# Patient Record
Sex: Female | Born: 1980 | Race: Black or African American | Hispanic: No | Marital: Married | Smoking: Never smoker
Health system: Southern US, Community
[De-identification: ages and names within clinical notes are randomized; demographics above are authoritative.]

## PROBLEM LIST (undated history)

## (undated) DIAGNOSIS — J45909 Unspecified asthma, uncomplicated: Secondary | ICD-10-CM

## (undated) HISTORY — DX: Unspecified asthma, uncomplicated: J45.909

---

## 2009-06-24 HISTORY — PX: TMJ ARTHROSCOPY: SHX1067

## 2018-07-10 ENCOUNTER — Institutional Professional Consult (permissible substitution): Payer: Self-pay | Admitting: Pulmonary Disease

## 2019-09-09 ENCOUNTER — Ambulatory Visit: Payer: Self-pay | Admitting: Allergy

## 2019-09-15 NOTE — Progress Notes (Signed)
New Patient Note  RE: Tracie Velasquez MRN: [id number] DOB: 06-Feb-1981 Date of Office Visit: 09/16/2019  Referring provider: Roger Kill, * Primary care provider: Roger Kill, PA-C  Chief Complaint: Patch Testing (Metal) and Allergy Testing (Shellfish)  History of Present Illness: I had the pleasure of seeing [facility] for initial evaluation at the [facility] on 09/16/2019. She is a 39 y.o. female, who is referred here by Roger Kill, PA-C and her oral surgeon for the evaluation of metal testing.  Metal: Patient is planning to have a TMJ joint replacement by oral surgeon and they want to confirm that she has no sensitivity to metals. She noted some itching after wearing costume earrings. Tolerates gold and silver. No previous joint replacements or metals in her body.   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 The reaction occurred in her 3s, after she ate shrimp. Symptoms started at least 1+ hour afterwards while she was exercising and was in the form of facial hives, facial swelling. Denies any wheezing, abdominal pain, diarrhea, vomiting. Denies any associated cofactors such as infection, NSAID use, or alcohol consumption. The symptoms lasted for a few hours. She was evaluated in ED and received some unknown medications. Since this episode, she does not report other accidental exposures to shellfish. She does not have access to epinephrine autoinjector. Prior to this she had shrimp with no issues.   Past work up includes:skin testing at that time was positive to shellfish per patient report.  Dietary History: patient has been eating other foods including lactose free milk, eggs, peanut, treenuts, sesame, seafood, soy, wheat, meats, fruits and vegetables.  She reports reading labels and avoiding shellfish and mollusks in diet completely.  Patient does have a history of hives in the past but states that the above reaction was  different than her normal hive outbreaks.   Assessment and Plan: Lekisha is a 39 y.o. female with: Contact dermatitis Patient concerned about metal allergy as she is due for TMJ joint replacement.  Patient has contact pruritus after wearing costume earrings but tolerates gold and silver.  No previous joint replacements.  Discussed with patient that she needs to check with her oral surgeon what components they are planning on using.  Gave her a list of the metal panel and true patch testing. If surgeon requires additional items to be tested then she must provide a sample that we can use to patch test with.   Adverse food reaction Reaction to shrimp in her 20s in the form of facial hives and facial swelling at least 1 hour after ingestion while exercising.  Evaluated in the ER and was treated with unknown medications with good benefit.  Previously tolerated shellfish but currently avoiding.  Skin testing in her 85s was positive to shellfish per patient report.  Tolerates finned fish with no issues.  Today's skin testing showed: Negative to shellfish and mollusks.   Continue strict avoidance of shellfish and mollusks.  Food allergen skin testing has excellent negative predictive value however there is still a 5% chance that the allergy exists. Therefore, we will investigate further with serum specific IgE levels and, if negative then schedule for open graded oral food challenge. A laboratory order form has been provided for serum specific IgE against shellfish and mollusks. Until the food allergy has been definitively ruled out, the patient is to continue meticulous avoidance of above foods.  I have prescribed epinephrine injectable and demonstrated proper use. For mild  symptoms you can take over the counter antihistamines such as Benadryl and monitor symptoms closely. If symptoms worsen or if you have severe symptoms including breathing issues, throat closure, significant swelling, whole body  hives, severe diarrhea and vomiting, lightheadedness then inject epinephrine and seek immediate medical care afterwards.  Food action plan given.   Mild intermittent asthma without complication Diagnosed with asthma in her 20s and uses albuterol as needed.  Last use about 1 year ago.  Triggers are unknown.  Today's spirometry showed some possible restriction.  May use albuterol rescue inhaler 2 puffs or nebulizer every 4 to 6 hours as needed for shortness of breath, chest tightness, coughing, and wheezing. May use albuterol rescue inhaler 2 puffs 5 to 15 minutes prior to strenuous physical activities. Monitor frequency of use.   History of urticaria History of hives as a child which she has outgrown.  Triggers include temperature change and sweating.  Return for Patch testing.  Meds ordered this encounter  Medications  . EPINEPHrine (AUVI-Q) 0.3 mg/0.3 mL IJ SOAJ injection    Sig: Inject 0.3 mLs (0.3 mg total) into the muscle once for 1 dose. As directed for life-threatening allergic reactions    Dispense:  2 each    Refill:  1    Please call [phone] for delivery.    Lab Orders     Allergen Profile, Shellfish  Other allergy screening: Asthma: yes  Diagnosed with asthma in her 20s and uses albuterol as needed. Last use was about 1 year ago. Triggers include not sure.  Rhino conjunctivitis: no  Medication allergy: no Hymenoptera allergy: no Urticaria: yes  Yes as a child but not anymore. Triggers include temperature change and sweating.  Eczema:no History of recurrent infections suggestive of immunodeficency: no  Diagnostics: Spirometry:  Tracings reviewed. Her effort: Good reproducible efforts. FVC: 3.16L FEV1: 2.67L, 72% predicted FEV1/FVC ratio: 84% Interpretation: Spirometry consistent with possible restrictive disease.  Please see scanned spirometry results for details.  Skin Testing: Select foods. Negative test to: shellfish and mollusks.  Results discussed  with patient/family. Food Adult Perc - 09/16/19 1400    Time Antigen Placed  1444    Allergen Manufacturer  Waynette Buttery    Location  Arm    Number of allergen test  8     Control-buffer 50% Glycerol  Negative    Control-Histamine 1 mg/ml  2+    8. Shellfish Mix  Negative    25. Shrimp  Negative    26. Crab  Negative    27. Lobster  Negative    28. Oyster  Negative    29. Scallops  Negative       Past Medical History: Patient Active Problem List   Diagnosis Date Noted  . Adverse food reaction 09/16/2019  . Mild intermittent asthma without complication 09/16/2019  . Contact dermatitis 09/16/2019  . History of urticaria 09/16/2019   Past Medical History:  Diagnosis Date  . Asthma    Past Surgical History: Past Surgical History:  Procedure Laterality Date  . TMJ ARTHROSCOPY  2011   Medication List:  Current Outpatient Medications  Medication Sig Dispense Refill  . albuterol (VENTOLIN HFA) 108 (90 Base) MCG/ACT inhaler Inhale 2 puffs into the lungs every 6 (six) hours as needed for wheezing or shortness of breath.    . phentermine 37.5 MG capsule Take 37.5 mg by mouth every morning.    Marland Kitchen EPINEPHrine (AUVI-Q) 0.3 mg/0.3 mL IJ SOAJ injection Inject 0.3 mLs (0.3 mg total) into the muscle  once for 1 dose. As directed for life-threatening allergic reactions 2 each 1   No current facility-administered medications for this visit.   Allergies: Not on File Social History: Social History   Socioeconomic History  . Marital status: Married    Spouse name: Not on file  . Number of children: Not on file  . Years of education: Not on file  . Highest education level: Not on file  Occupational History  . Not on file  Tobacco Use  . Smoking status: Never Smoker  . Smokeless tobacco: Never Used  Substance and Sexual Activity  . Alcohol use: Not Currently  . Drug use: Never  . Sexual activity: Not on file  Other Topics Concern  . Not on file  Social History Narrative  . Not on file    Social Determinants of Health   Financial Resource Strain:   . Difficulty of Paying Living Expenses:   Food Insecurity:   . Worried About Programme researcher, broadcasting/film/video in the Last Year:   . Barista in the Last Year:   Transportation Needs:   . Freight forwarder (Medical):   Marland Kitchen Lack of Transportation (Non-Medical):   Physical Activity:   . Days of Exercise per Week:   . Minutes of Exercise per Session:   Stress:   . Feeling of Stress :   Social Connections:   . Frequency of Communication with Friends and Family:   . Frequency of Social Gatherings with Friends and Family:   . Attends Religious Services:   . Active Member of Clubs or Organizations:   . Attends Banker Meetings:   Marland Kitchen Marital Status:    Lives in a 37 year old home. Smoking: denies Occupation: Nurse, adult HistorySurveyor, minerals in the house: no Engineer, civil (consulting) in the family room: no Carpet in the bedroom: no Heating: gas Cooling: central Pet: yes 1 dog x 4-5 years  Family History: Family History  Problem Relation Age of Onset  . Eczema Mother   . Asthma Sister    Review of Systems  Constitutional: Negative for appetite change, chills, fever and unexpected weight change.  HENT: Negative for congestion and rhinorrhea.   Eyes: Negative for itching.  Respiratory: Negative for cough, chest tightness, shortness of breath and wheezing.   Cardiovascular: Negative for chest pain.  Gastrointestinal: Negative for abdominal pain.  Genitourinary: Negative for difficulty urinating.  Skin: Negative for rash.  Allergic/Immunologic: Positive for food allergies.  Neurological: Negative for headaches.   Objective: BP 114/76 (BP Location: Left Arm, Patient Position: Sitting, Cuff Size: Normal)   Pulse 78   Temp (!) 96.7 F (35.9 C) (Temporal)   Resp 18   Ht 5' 10.75" (1.797 m)   Wt 227 lb 12.8 oz (103.3 kg)   SpO2 100%   BMI 32.00 kg/m  Body mass index is 32 kg/m. Physical  Exam  Constitutional: She is oriented to person, place, and time. She appears well-developed and well-nourished.  HENT:  Head: Normocephalic and atraumatic.  Right Ear: External ear normal.  Left Ear: External ear normal.  Nose: Nose normal.  Mouth/Throat: Oropharynx is clear and moist.  Eyes: Conjunctivae and EOM are normal.  Cardiovascular: Normal rate, regular rhythm and normal heart sounds. Exam reveals no gallop and no friction rub.  No murmur heard. Pulmonary/Chest: Effort normal and breath sounds normal. She has no wheezes. She has no rales.  Abdominal: Soft.  Musculoskeletal:     Cervical back: Neck supple.  Neurological: She is alert and oriented to person, place, and time.  Skin: Skin is warm. No rash noted.  Psychiatric: She has a normal mood and affect. Her behavior is normal.  Nursing note and vitals reviewed.  The plan was reviewed with the patient/family, and all questions/concerned were addressed.  It was my pleasure to see Rozlynn today and participate in her care. Please feel free to contact me with any questions or concerns.  Sincerely,  Rexene Alberts, DO Allergy & Immunology  [facility] office: [phone] [facility] office: [place] office: [phone]

## 2019-09-16 ENCOUNTER — Other Ambulatory Visit: Payer: Self-pay

## 2019-09-16 ENCOUNTER — Ambulatory Visit (INDEPENDENT_AMBULATORY_CARE_PROVIDER_SITE_OTHER): Payer: Managed Care, Other (non HMO) | Admitting: Allergy

## 2019-09-16 ENCOUNTER — Encounter: Payer: Self-pay | Admitting: Allergy

## 2019-09-16 VITALS — BP 114/76 | HR 78 | Temp 96.7°F | Resp 18 | Ht 70.75 in | Wt 227.8 lb

## 2019-09-16 DIAGNOSIS — J4520 Mild intermittent asthma, uncomplicated: Secondary | ICD-10-CM | POA: Diagnosis not present

## 2019-09-16 DIAGNOSIS — L259 Unspecified contact dermatitis, unspecified cause: Secondary | ICD-10-CM

## 2019-09-16 DIAGNOSIS — T781XXA Other adverse food reactions, not elsewhere classified, initial encounter: Secondary | ICD-10-CM | POA: Insufficient documentation

## 2019-09-16 DIAGNOSIS — T781XXD Other adverse food reactions, not elsewhere classified, subsequent encounter: Secondary | ICD-10-CM | POA: Diagnosis not present

## 2019-09-16 DIAGNOSIS — L239 Allergic contact dermatitis, unspecified cause: Secondary | ICD-10-CM | POA: Insufficient documentation

## 2019-09-16 DIAGNOSIS — Z872 Personal history of diseases of the skin and subcutaneous tissue: Secondary | ICD-10-CM | POA: Diagnosis not present

## 2019-09-16 MED ORDER — EPINEPHRINE 0.3 MG/0.3ML IJ SOAJ: 0.3000 mg | Freq: Once | INTRAMUSCULAR | 1 refills | Status: AC

## 2019-09-16 NOTE — Assessment & Plan Note (Signed)
Patient concerned about metal allergy as she is due for TMJ joint replacement.  Patient has contact pruritus after wearing costume earrings but tolerates gold and silver.  No previous joint replacements.  Discussed with patient that she needs to check with her oral surgeon what components they are planning on using.  Gave her a list of the metal panel and true patch testing. If surgeon requires additional items to be tested then she must provide a sample that we can use to patch test with.

## 2019-09-16 NOTE — Assessment & Plan Note (Signed)
History of hives as a child which she has outgrown.  Triggers include temperature change and sweating.

## 2019-09-16 NOTE — Patient Instructions (Addendum)
Today's skin testing showed:  Negative to shellfish and mollusks.   Food:  Continue strict avoidance of shellfish and mollusks.  Food allergen skin testing has excellent negative predictive value however there is still a 5% chance that the allergy exists. Therefore, we will investigate further with serum specific IgE levels and, if negative then schedule for open graded oral food challenge. A laboratory order form has been provided for serum specific IgE against shellfish and mollusks. Until the food allergy has been definitively ruled out, the patient is to continue meticulous avoidance of above foods.   I have prescribed epinephrine injectable and demonstrated proper use. For mild symptoms you can take over the counter antihistamines such as Benadryl and monitor symptoms closely. If symptoms worsen or if you have severe symptoms including breathing issues, throat closure, significant swelling, whole body hives, severe diarrhea and vomiting, lightheadedness then inject epinephrine and seek immediate medical care afterwards.  Food action plan given.   Asthma:  May use albuterol rescue inhaler 2 puffs or nebulizer every 4 to 6 hours as needed for shortness of breath, chest tightness, coughing, and wheezing. May use albuterol rescue inhaler 2 puffs 5 to 15 minutes prior to strenuous physical activities. Monitor frequency of use.   Metals and patch testing:  Please check with your oral surgeon what metals/materials they need the patch testing for. Metals Patch    Aluminum Hydroxide 10%    Chromium chloride 1%    Cobalt chloride hexahydrate 1%    Molybdenum chloride 0.5%    Nickel sulfate hexahydrate 5%    Potassium dichromate 0.25%    Copper sulfate pentahydrate 2%    Tantal 1%    Titanium 0.1%    Manganese chloride 0.5%    Vanadium Pentoxide 10%    Patches are best placed on Monday with return to office on Wednesday and Friday of same week for readings.  Patches once placed should  not get wet.  You do not have to stop any medications for patch testing but should not be on oral prednisone. You can schedule a patch testing visit when convenient for your schedule.   True Test looks for the following sensitivities:       Schedule for patch testing.

## 2019-09-16 NOTE — Assessment & Plan Note (Signed)
Diagnosed with asthma in her 65s and uses albuterol as needed.  Last use about 1 year ago.  Triggers are unknown.  Today's spirometry showed some possible restriction.  May use albuterol rescue inhaler 2 puffs or nebulizer every 4 to 6 hours as needed for shortness of breath, chest tightness, coughing, and wheezing. May use albuterol rescue inhaler 2 puffs 5 to 15 minutes prior to strenuous physical activities. Monitor frequency of use.

## 2019-09-16 NOTE — Assessment & Plan Note (Signed)
Reaction to shrimp in her 20s in the form of facial hives and facial swelling at least 1 hour after ingestion while exercising.  Evaluated in the ER and was treated with unknown medications with good benefit.  Previously tolerated shellfish but currently avoiding.  Skin testing in her 109s was positive to shellfish per patient report.  Tolerates finned fish with no issues.  Today's skin testing showed: Negative to shellfish and mollusks.   Continue strict avoidance of shellfish and mollusks.  Food allergen skin testing has excellent negative predictive value however there is still a 5% chance that the allergy exists. Therefore, we will investigate further with serum specific IgE levels and, if negative then schedule for open graded oral food challenge. A laboratory order form has been provided for serum specific IgE against shellfish and mollusks. Until the food allergy has been definitively ruled out, the patient is to continue meticulous avoidance of above foods.  I have prescribed epinephrine injectable and demonstrated proper use. For mild symptoms you can take over the counter antihistamines such as Benadryl and monitor symptoms closely. If symptoms worsen or if you have severe symptoms including breathing issues, throat closure, significant swelling, whole body hives, severe diarrhea and vomiting, lightheadedness then inject epinephrine and seek immediate medical care afterwards.  Food action plan given.

## 2019-10-04 ENCOUNTER — Telehealth: Payer: Self-pay | Admitting: Allergy

## 2019-10-04 NOTE — Telephone Encounter (Signed)
Called and left a detailed voicemail per DPR permission advising that we do have Nickel in our metals patch testing. Asked patient to call back with any further questions.

## 2019-10-04 NOTE — Telephone Encounter (Signed)
Patient is scheduled for patch testing next week. She called this morning to make sure we have the correct metal that she needs to be tested for; it is nickel. She said she came to an appointment before and we did not have it.

## 2019-10-10 NOTE — Progress Notes (Signed)
   Follow Up Note  RE: Tracie Velasquez MRN: [id number] DOB: [birth year] Date of Office Visit: 10/11/2019  Referring provider: Roger Kill, * Primary care provider: Roger Kill, PA-C  History of Present Illness: I had the pleasure of seeing [facility] for a follow up visit at the [facility] [place] on 10/11/2019. She is a 39 y.o. female, who is being followed for dermatitis, adverse food reaction, asthma and h/o urticaria. Today she is here for patch test placement, given suspected history of contact dermatitis.   Contact dermatitis Surgeon told her they will be using Nickel material for the TMJ joint replacement.   Diagnostics: TRUE Test and metal patches placed.   Assessment and Plan: [name] is a 39 y.o. female with: Contact dermatitis Past history - Patient concerned about metal allergy as she is due for TMJ joint replacement.  Patient has contact pruritus after wearing costume earrings but tolerates gold and silver.  No previous joint replacements. Interim history - planning to use Nickel per surgeon.  TRUE test and metal patches placed today.    The patient was instructed regarding proper care of the patches for the next 48 hours. Do not get patches wet - avoid showering until the next visit. Do not engage in vigorous physical activity.  Patient will follow up in 48 hours and 96 hours for patch readings.  It was my pleasure to see Tracie Velasquez today and participate in her care. Please feel free to contact me with any questions or concerns.  Sincerely,  Wyline Mood, DO Allergy & Immunology  [facility] office: [phone] [facility] office: [phone] [place] office: [phone]

## 2019-10-11 ENCOUNTER — Ambulatory Visit (INDEPENDENT_AMBULATORY_CARE_PROVIDER_SITE_OTHER): Payer: Managed Care, Other (non HMO) | Admitting: Allergy

## 2019-10-11 ENCOUNTER — Encounter: Payer: Self-pay | Admitting: Allergy

## 2019-10-11 ENCOUNTER — Other Ambulatory Visit: Payer: Self-pay

## 2019-10-11 VITALS — BP 138/90 | HR 78 | Temp 97.3°F | Resp 16 | Ht 71.0 in | Wt 220.2 lb

## 2019-10-11 DIAGNOSIS — L239 Allergic contact dermatitis, unspecified cause: Secondary | ICD-10-CM

## 2019-10-11 NOTE — Assessment & Plan Note (Signed)
Past history - Patient concerned about metal allergy as she is due for TMJ joint replacement.  Patient has contact pruritus after wearing costume earrings but tolerates gold and silver.  No previous joint replacements. Interim history - planning to use Nickel per surgeon.  TRUE test and metal patches placed today.

## 2019-10-11 NOTE — Patient Instructions (Signed)
Patches placed today. Please avoid strenuous physical activities and do not get the patches on the back wet. No showering until final patch reading done. Okay to take antihistamines for itching but avoid placing any creams on the back where the patches are. We will remove the patches on Wednesday and will do our initial read. Then you will come back on Friday for a final read. 

## 2019-10-12 NOTE — Progress Notes (Signed)
   Follow Up Note  RE: Tracie Velasquez MRN: [id number] DOB: 28-May-1981 Date of Office Visit: 10/13/2019  Referring provider: Roger Kill, * Primary care provider: Roger Kill, PA-C  History of Present Illness: I had the pleasure of seeing Tracie Velasquez for a follow up visit at [facility] [place] on 10/13/2019. She is a 39 y.o. female, who is being followed for dermatitis, adverse food reaction, asthma and h/o urticaria. Today she is here for initial patch test interpretation, given suspected history of contact dermatitis.   Diagnostics:  TRUE TEST and metal patch 48 hour reading:  T.R.U.E. Test - 10/13/19 1600    1. Nickel Sulfate  1    4. Potassium Dichromate  1        Assessment and Plan: Tracie Velasquez is a 39 y.o. female with: Allergic contact dermatitis Past history - Patient concerned about metal allergy as she is due for TMJ joint replacement.  Patient has contact pruritus after wearing costume earrings but tolerates gold and silver.  No previous joint replacements. Interim history - planning to use Nickel per surgeon.  TRUE test and metal patches removed today.  Positive to nickel sulfate and potassium dichromate.   The patient has been provided detailed information regarding the substances she is sensitive to, as well as products containing the substances.  Meticulous avoidance of these substances is recommended. If avoidance is not possible, the use of barrier creams or lotions is recommended.  Return in about 2 days (around 10/15/2019) for Patch reading.  It was my pleasure to see Tracie Velasquez today and participate in her care. Please feel free to contact me with any questions or concerns.  Sincerely,  Wyline Mood, DO Allergy & Immunology  [facility] office: [phone] [facility] office: [phone] [place] office: [phone]

## 2019-10-13 ENCOUNTER — Ambulatory Visit: Payer: Managed Care, Other (non HMO) | Admitting: Allergy

## 2019-10-13 ENCOUNTER — Other Ambulatory Visit: Payer: Self-pay

## 2019-10-13 ENCOUNTER — Encounter: Payer: Self-pay | Admitting: Allergy

## 2019-10-13 DIAGNOSIS — L239 Allergic contact dermatitis, unspecified cause: Secondary | ICD-10-CM

## 2019-10-13 NOTE — Assessment & Plan Note (Signed)
Past history - Patient concerned about metal allergy as she is due for TMJ joint replacement.  Patient has contact pruritus after wearing costume earrings but tolerates gold and silver.  No previous joint replacements. Interim history - planning to use Nickel per surgeon.  TRUE test and metal patches removed today.  Positive to nickel sulfate and potassium dichromate.

## 2019-10-15 ENCOUNTER — Ambulatory Visit: Payer: Managed Care, Other (non HMO) | Admitting: Family Medicine

## 2019-10-15 ENCOUNTER — Other Ambulatory Visit: Payer: Self-pay

## 2019-10-15 ENCOUNTER — Encounter: Payer: Self-pay | Admitting: Family Medicine

## 2019-10-15 DIAGNOSIS — L230 Allergic contact dermatitis due to metals: Secondary | ICD-10-CM

## 2019-10-15 LAB — ALLERGEN PROFILE, SHELLFISH
Clam IgE: <0.1 kU/L
F023-IgE Crab: <0.1 kU/L
F080-IgE Lobster: <0.1 kU/L
F290-IgE Oyster: <0.1 kU/L
Scallop IgE: <0.1 kU/L
Shrimp IgE: <0.1 kU/L

## 2019-10-15 NOTE — Patient Instructions (Signed)
TRUE TEST 96-hour hour reading: positive reaction to #1 (Nickel Sulfate) and possible reaction to #4 (Potassium dichromate)   Metal patch test 96 hour reading: positive to nickel sulfate hexahydrate 5% Plan:   Allergic contact dermatitis - The patient has been provided detailed information regarding the substances she is sensitive to, as well as products containing the substances.   - Meticulous avoidance of these substances is recommended.  - If avoidance is not possible, the use of barrier creams or lotions is recommended. - If symptoms persist or progress despite meticulous avoidance of the substances listed above, a dermatology referral may be warranted.  Call the clinic if this treatment plan is not working well for you  Follow up in 6 months or sooner if needed.

## 2019-10-15 NOTE — Progress Notes (Signed)
    Follow-up Note  RE: Tracie Velasquez MRN: [id number] DOB: [birth year] Date of Office Visit: 10/15/2019  Primary care provider: Roger Kill, PA-C Referring provider: Herbie [place] returns to the office today for the final patch test interpretation, given suspected history of contact dermatitis.    Diagnostics:   TRUE TEST 96-hour hour reading: positive reaction to #1 (Nickel Sulfate) and possible reaction to #4 (Potassium dichromate)   Metal patch test 96 hour reading: positive to nickel sulfate hexahydrate 5% Plan:   Allergic contact dermatitis - The patient has been provided detailed information regarding the substances she is sensitive to, as well as products containing the substances.   - Meticulous avoidance of these substances is recommended.  - If avoidance is not possible, the use of barrier creams or lotions is recommended. - If symptoms persist or progress despite meticulous avoidance of the substances listed above, a dermatology referral may be warranted.

## 2019-10-18 ENCOUNTER — Ambulatory Visit: Payer: Managed Care, Other (non HMO) | Admitting: Allergy and Immunology

## 2019-10-18 ENCOUNTER — Telehealth: Payer: Self-pay | Admitting: *Deleted

## 2019-10-18 NOTE — Telephone Encounter (Signed)
Most recent office notes have been faxed to patient's surgeon at [facility] at [phone] per Anne's Request to Dr. Garnette Gunner. Patient is aware.

## 2019-11-10 ENCOUNTER — Telehealth: Payer: Self-pay | Admitting: Allergy

## 2019-11-10 NOTE — Telephone Encounter (Signed)
Called and advised to patient. Patient verbalized understanding.  

## 2019-11-10 NOTE — Telephone Encounter (Signed)
Please call patient. She is scheduled for a shellfish challenge on 5/27.  Please make sure she brings large shrimp - 6-8 pieces at least (a good serving size) Cooked and only lightly seasoned.  You must be off antihistamines for 3-5 days before. Must be in good health and not ill - not on antibiotics and no vaccines within the week. Plan on being in the office for 2-3 hours and must bring in the food you want to do the oral challenge for.    Thank you.

## 2019-11-18 ENCOUNTER — Other Ambulatory Visit: Payer: Self-pay

## 2019-11-18 ENCOUNTER — Ambulatory Visit (INDEPENDENT_AMBULATORY_CARE_PROVIDER_SITE_OTHER): Payer: Managed Care, Other (non HMO) | Admitting: Allergy

## 2019-11-18 ENCOUNTER — Encounter: Payer: Self-pay | Admitting: Allergy

## 2019-11-18 VITALS — BP 118/90 | HR 82 | Resp 16

## 2019-11-18 DIAGNOSIS — T781XXD Other adverse food reactions, not elsewhere classified, subsequent encounter: Secondary | ICD-10-CM | POA: Diagnosis not present

## 2019-11-18 NOTE — Patient Instructions (Addendum)
Do not eat challenge food for next 24 hours and monitor for hives, swelling, shortness of breath and dizziness. If you see these symptoms, use Benadryl for mild symptoms and epinephrine for more severe symptoms and call 911.  If no adverse symptoms in the next 24 hours, repeat the challenge food the next day and observe for 1 hour. If no adverse symptoms, can eat the food on regular basis.   You may try different shellfish at home such as crab, lobster and you can try the mollusks (scallops, oysters, mussels) at home as well. I would do 1 new food every 3 days or so.  I will email you the safe list later today.  Follow up in 12 months or sooner if needed.

## 2019-11-18 NOTE — Assessment & Plan Note (Signed)
Past history - Reaction to shrimp in her 77s in the form of facial hives and facial swelling at least 1 hour after ingestion while exercising.  Evaluated in the ER and was treated with unknown medications with good benefit.  Previously tolerated shellfish but currently avoiding.  Skin testing in her 58s was positive to shellfish per patient report.  Tolerates finned fish with no issues. 2021 skin testing and bloodwork: Negative to shellfish and mollusks.  Interim history - No reactions.  Passed shrimp challenge in the office.  Do not eat challenge food for next 24 hours and monitor for hives, swelling, shortness of breath and dizziness. If you see these symptoms, use Benadryl for mild symptoms and epinephrine for more severe symptoms and call 911.  If no adverse symptoms in the next 24 hours, repeat the challenge food the next day and observe for 1 hour. If no adverse symptoms, can eat the food on regular basis.   You may try different shellfish at home such as crab, lobster and you can try the mollusks (scallops, oysters, mussels) at home as well. Recommend 1 new food every 3 days or so.  For mild symptoms you can take over the counter antihistamines such as Benadryl and monitor symptoms closely. If symptoms worsen or if you have severe symptoms including breathing issues, throat closure, significant swelling, whole body hives, severe diarrhea and vomiting, lightheadedness then inject epinephrine and seek immediate medical care afterwards.

## 2019-11-18 NOTE — Progress Notes (Signed)
Follow Up Note  RE: Tracie Velasquez MRN: [id number] DOB: 23-Oct-1980 Date of Office Visit: 11/18/2019  Referring provider: Heywood Bene, * Primary care provider: Merwyn Katos  Chief Complaint:Food/Drug Challenge (Shrimp)   Assessment and Plan: Tracie Velasquez is a 39 y.o. female with: Adverse food reaction Past history - Reaction to shrimp in her 20s in the form of facial hives and facial swelling at least 1 hour after ingestion while exercising.  Evaluated in the ER and was treated with unknown medications with good benefit.  Previously tolerated shellfish but currently avoiding.  Skin testing in her 41s was positive to shellfish per patient report.  Tolerates finned fish with no issues. 2021 skin testing and bloodwork: Negative to shellfish and mollusks.  Interim history - No reactions.  Passed shrimp challenge in the office.  Do not eat challenge food for next 24 hours and monitor for hives, swelling, shortness of breath and dizziness. If you see these symptoms, use Benadryl for mild symptoms and epinephrine for more severe symptoms and call 911.  If no adverse symptoms in the next 24 hours, repeat the challenge food the next day and observe for 1 hour. If no adverse symptoms, can eat the food on regular basis.   You may try different shellfish at home such as crab, lobster and you can try the mollusks (scallops, oysters, mussels) at home as well. Recommend 1 new food every 3 days or so.  For mild symptoms you can take over the counter antihistamines such as Benadryl and monitor symptoms closely. If symptoms worsen or if you have severe symptoms including breathing issues, throat closure, significant swelling, whole body hives, severe diarrhea and vomiting, lightheadedness then inject epinephrine and seek immediate medical care afterwards.  Return in about 1 year (around 11/17/2020).  Challenge food: shrimp Challenge as per protocol: Passed Total time: 125  min  Do not eat challenge food for next 24 hours and monitor for hives, swelling, shortness of breath and dizziness. If you see these symptoms, use Benadryl for mild symptoms and epinephrine for more severe symptoms and call 911.  If no adverse symptoms in the next 24 hours, repeat the challenge food the next day and observe for 1 hour. If no adverse symptoms, can eat the food on regular basis.   History of Present Illness: I had the pleasure of seeing Tracie Velasquez for a follow up visit at the [facility] on 11/18/2019. She is a 39 y.o. female, who is being followed for contact dermatitis, adverse food reaction, asthma, history of urticaria. Today she is here for shrimp food challenge. Her previous allergy office visit was on 10/15/2019 with Tracie Velasquez, [place].   History of Reaction: Reaction to shrimp in her 20s in the form of facial hives and facial swelling at least 1 hour after ingestion while exercising.  Evaluated in the ER and was treated with unknown medications with good benefit.  Previously tolerated shellfish but currently avoiding.  Skin testing in her 34s was positive to shellfish per patient report.  Tolerates finned fish with no issues.  Labs/skin testing: 09/16/2019 skin testing was negative to shellfish and mollusks. 10/11/2019 bloodwork was negative to shellfish and mollusks.  Interval History: Patient has not been ill, she has not had any accidental exposures to the culprit food.   Recent/Current History: Pulmonary disease: yes  Asthma is well controlled.  Cardiac disease: no Respiratory infection: no Rash: no Itch: no Swelling: no Cough: no Shortness of breath:  no Runny/stuffy nose: no Itchy eyes: no Beta-blocker use: no  Patient/guardian was informed of the test procedure with verbalized understanding of the risk of anaphylaxis.   Last antihistamine use: more than 3 days ago Last beta-blocker use: n/a  Medication List:  Current Outpatient  Medications  Medication Sig Dispense Refill  . albuterol (VENTOLIN HFA) 108 (90 Base) MCG/ACT inhaler Inhale 2 puffs into the lungs every 6 (six) hours as needed for wheezing or shortness of breath.     No current facility-administered medications for this visit.   Allergies: No Known Allergies  I reviewed her past medical history, social history, family history, and environmental history and no significant changes have been reported from her previous visit.  Review of Systems  Constitutional: Negative for appetite change, chills, fever and unexpected weight change.  HENT: Negative for congestion and rhinorrhea.   Eyes: Negative for itching.  Respiratory: Negative for cough, chest tightness, shortness of breath and wheezing.   Cardiovascular: Negative for chest pain.  Gastrointestinal: Negative for abdominal pain.  Genitourinary: Negative for difficulty urinating.  Skin: Negative for rash.  Neurological: Negative for headaches.   Objective: BP 118/90 (BP Location: Right Arm, Patient Position: Sitting, Cuff Size: Normal)   Pulse 82   Resp 16   SpO2 100%  There is no height or weight on file to calculate BMI. Physical Exam  Constitutional: She is oriented to person, place, and time. She appears well-developed and well-nourished.  HENT:  Head: Normocephalic and atraumatic.  Right Ear: External ear normal.  Left Ear: External ear normal.  Nose: Nose normal.  Mouth/Throat: Oropharynx is clear and moist.  Eyes: Conjunctivae and EOM are normal.  Cardiovascular: Normal rate, regular rhythm and normal heart sounds. Exam reveals no gallop and no friction rub.  No murmur heard. Pulmonary/Chest: Effort normal and breath sounds normal. She has no wheezes. She has no rales.  Abdominal: Soft.  Musculoskeletal:     Cervical back: Neck supple.  Neurological: She is alert and oriented to person, place, and time.  Skin: Skin is warm. No rash noted.  Psychiatric: She has a normal Velasquez and  affect. Her behavior is normal.  Nursing note and vitals reviewed.  Diagnostics: Results discussed with patient/family. Oral Challenge - 11/18/19 0900    Challenge Food/Drug  Shrimp    Food/Drug provided by  Patient    BP  118/90    Time  0900    Dose  2.5g    Time  0920    Dose  6.5g    Time  0945    Dose  26g    Time  1005    Dose  45.5g    BP  122/94    Pulse  80    Respirations  16       Previous notes and tests were reviewed. The plan was reviewed with the patient/family, and all questions/concerned were addressed.  It was my pleasure to see Tracie Velasquez today and participate in her care. Please feel free to contact me with any questions or concerns.  Sincerely,  Tracie Mood, DO Allergy & Immunology  [facility] office: [phone] [facility] office:[phone] [place] office: [phone]

## 2020-05-20 ENCOUNTER — Emergency Department (HOSPITAL_COMMUNITY)
Admission: EM | Admit: 2020-05-20 | Discharge: 2020-05-20 | Disposition: A | Discharge status: Home / Self Care | Payer: Managed Care, Other (non HMO) | Attending: Emergency Medicine | Admitting: Emergency Medicine

## 2020-05-20 ENCOUNTER — Other Ambulatory Visit: Payer: Self-pay

## 2020-05-20 ENCOUNTER — Emergency Department (HOSPITAL_COMMUNITY): Payer: Managed Care, Other (non HMO)

## 2020-05-20 DIAGNOSIS — J4520 Mild intermittent asthma, uncomplicated: Secondary | ICD-10-CM | POA: Insufficient documentation

## 2020-05-20 DIAGNOSIS — Y92410 Unspecified street and highway as the place of occurrence of the external cause: Secondary | ICD-10-CM | POA: Diagnosis not present

## 2020-05-20 DIAGNOSIS — S161XXA Strain of muscle, fascia and tendon at neck level, initial encounter: Secondary | ICD-10-CM | POA: Insufficient documentation

## 2020-05-20 DIAGNOSIS — R6884 Jaw pain: Secondary | ICD-10-CM | POA: Diagnosis not present

## 2020-05-20 DIAGNOSIS — S0990XA Unspecified injury of head, initial encounter: Secondary | ICD-10-CM | POA: Diagnosis present

## 2020-05-20 MED ORDER — IBUPROFEN 600 MG PO TABS: 600.0000 mg | ORAL_TABLET | Freq: Four times a day (QID) | ORAL | 0 refills | Status: AC | PRN

## 2020-05-20 MED ORDER — METHOCARBAMOL 500 MG PO TABS: 500.0000 mg | ORAL_TABLET | Freq: Two times a day (BID) | ORAL | 0 refills | Status: AC

## 2020-05-20 MED ORDER — OXYCODONE-ACETAMINOPHEN 5-325 MG PO TABS
1.0000 | ORAL_TABLET | ORAL | Status: DC | PRN
  Administered 2020-05-20: 1 via ORAL
  Filled 2020-05-20: qty 1
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
## 2020-05-20 NOTE — Discharge Instructions (Signed)
I included copies of your CT report read.  There were no broken bones reported.  You can follow up with your oral surgeon if you continue having jaw pain.  You can expect to be sore for the next week.  Drink lots of water at home.
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
